# Patient Record
Sex: Male | Born: 2000
Health system: Southern US, Community
[De-identification: ages and names within clinical notes are randomized; demographics above are authoritative.]

## PROBLEM LIST (undated history)

## (undated) HISTORY — PX: NO PAST SURGERIES: SHX2092

---
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
## 2020-04-12 IMAGING — DX DG CHEST 2V
2 series · 2 of 2 positions shown · non-contrast
Comparison: None.

CLINICAL DATA: Atypical chest pain

EXAM:
CHEST - 2 VIEW

[chest pa]
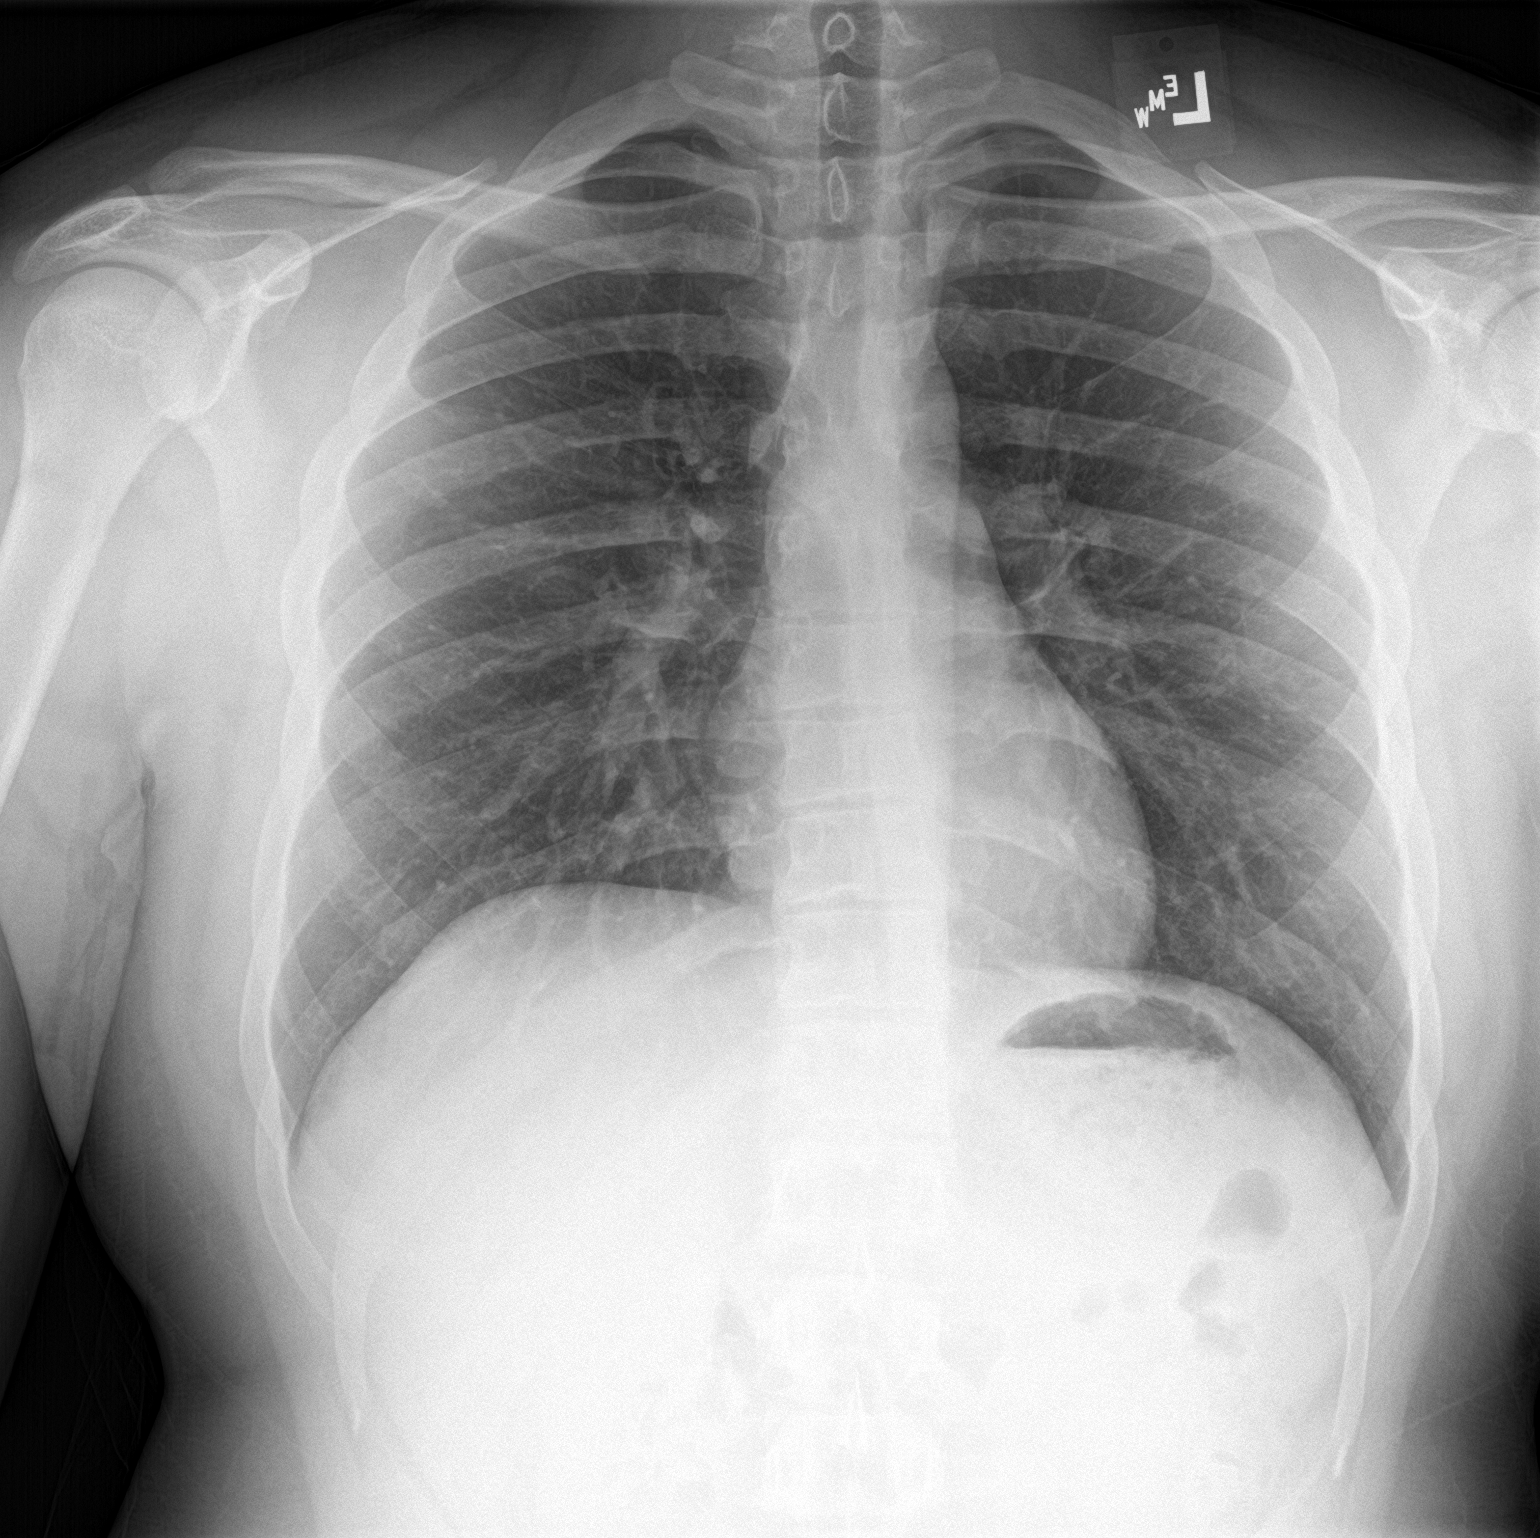

[chest lat]
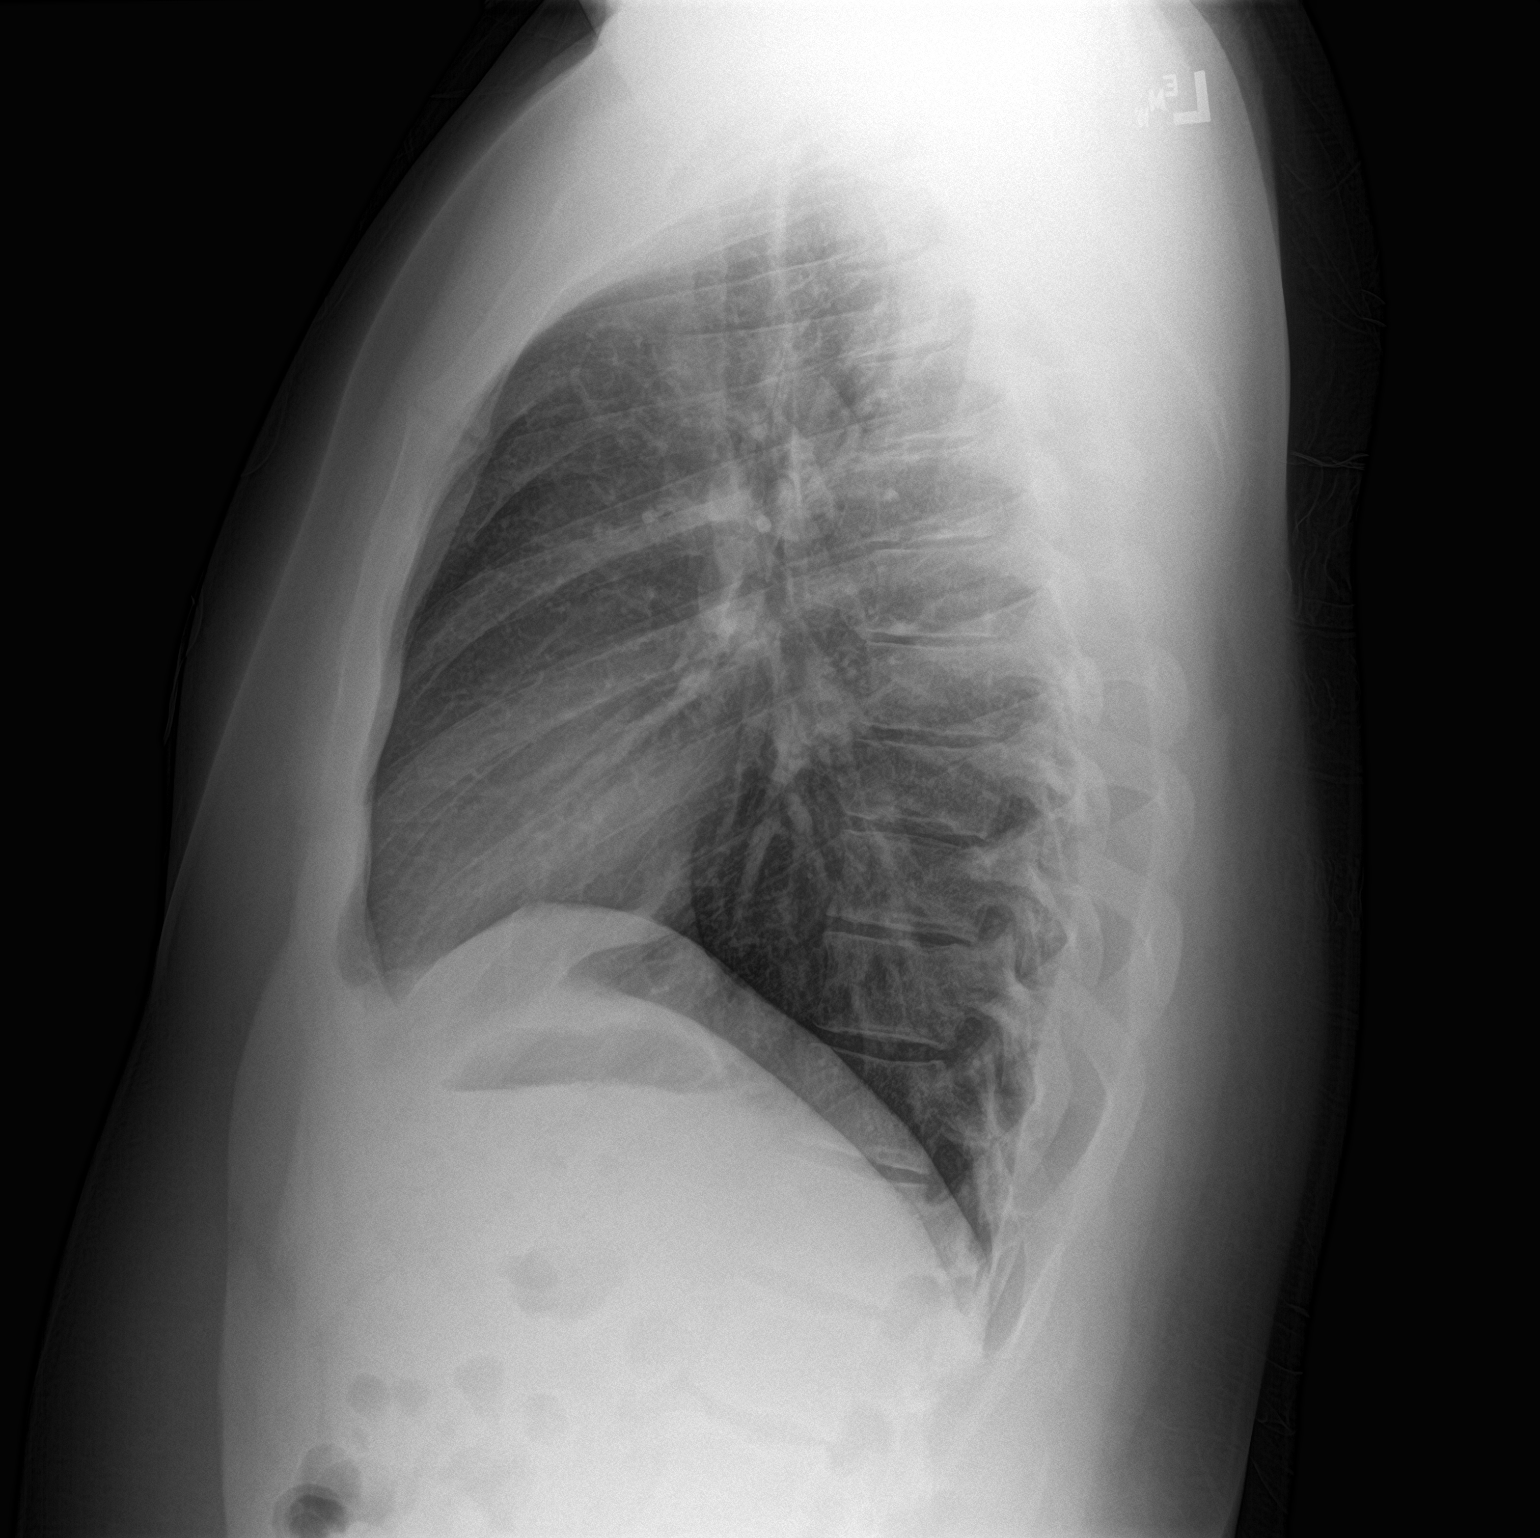

[2 of 2 positions shown; findings below may reference images not displayed]

FINDINGS: The heart size and mediastinal contours are within normal limits.
Both lungs are clear. The visualized skeletal structures are
unremarkable.
IMPRESSION: No active cardiopulmonary disease.

## 2020-11-21 ENCOUNTER — Encounter: Payer: 59 | Admitting: Family Medicine

## 2020-11-27 ENCOUNTER — Other Ambulatory Visit: Payer: Self-pay

## 2020-11-27 ENCOUNTER — Ambulatory Visit (INDEPENDENT_AMBULATORY_CARE_PROVIDER_SITE_OTHER): Payer: 59 | Admitting: Family Medicine

## 2020-11-27 ENCOUNTER — Encounter: Payer: Self-pay | Admitting: Family Medicine

## 2020-11-27 VITALS — BP 126/74 | HR 72 | Ht 72.0 in | Wt 246.0 lb

## 2020-11-27 DIAGNOSIS — Z23 Encounter for immunization: Secondary | ICD-10-CM

## 2020-11-27 DIAGNOSIS — Z0000 Encounter for general adult medical examination without abnormal findings: Secondary | ICD-10-CM

## 2020-11-27 LAB — LIPID PANEL
Cholesterol: 147 mg/dL (ref <170)
HDL: 51 mg/dL (ref >45)
LDL Cholesterol (Calc): 77 mg/dL (calc) (ref <110)
Non-HDL Cholesterol (Calc): 96 mg/dL (calc) (ref <120)
Total CHOL/HDL Ratio: 2.9 (calc) (ref <5.0)
Triglycerides: 107 mg/dL — ABNORMAL HIGH (ref <90)

## 2020-11-27 LAB — COMPLETE METABOLIC PANEL WITH GFR
AG Ratio: 1.8 (calc) (ref 1.0–2.5)
ALT: 20 U/L (ref 8–46)
AST: 17 U/L (ref 12–32)
Albumin: 4.6 g/dL (ref 3.6–5.1)
Alkaline phosphatase (APISO): 68 U/L (ref 46–169)
BUN: 16 mg/dL (ref 7–20)
CO2: 26 mmol/L (ref 20–32)
Calcium: 9.8 mg/dL (ref 8.9–10.4)
Chloride: 106 mmol/L (ref 98–110)
Creat: 0.8 mg/dL (ref 0.60–1.26)
GFR, Est African American: 150 mL/min/{1.73_m2} (ref >=60)
GFR, Est Non African American: 130 mL/min/{1.73_m2} (ref >=60)
Globulin: 2.5 g/dL (calc) (ref 2.1–3.5)
Glucose, Bld: 91 mg/dL (ref 65–99)
Potassium: 4.5 mmol/L (ref 3.8–5.1)
Sodium: 139 mmol/L (ref 135–146)
Total Bilirubin: 0.6 mg/dL (ref 0.2–1.1)
Total Protein: 7.1 g/dL (ref 6.3–8.2)

## 2020-11-27 LAB — CBC
HCT: 44.2 % (ref 38.5–50.0)
Hemoglobin: 14.8 g/dL (ref 13.2–17.1)
MCH: 27.8 pg (ref 27.0–33.0)
MCHC: 33.5 g/dL (ref 32.0–36.0)
MCV: 82.9 fL (ref 80.0–100.0)
MPV: 9.2 fL (ref 7.5–12.5)
Platelets: 380 10*3/uL (ref 140–400)
RBC: 5.33 10*6/uL (ref 4.20–5.80)
RDW: 12.5 % (ref 11.0–15.0)
WBC: 4.7 10*3/uL (ref 3.8–10.8)

## 2020-11-27 NOTE — Progress Notes (Signed)
CPE  Established Patient Office Visit  Subjective:  Patient ID: Ivan Adams, male    DOB: 2001-06-06  Age: 19 y.o. MRN: [id number]  CC:  Chief Complaint  Patient presents with  . Annual Exam    HPI LAMOYNE HESSEL presents for CPE.  Had eye exam a month ago. Wears glasses.  Exercises some but not consistently.  He is in school at [employer] in the [employer] he also works at [employer].  Not currently sexually active.  Denies drinking alcohol.  No past medical history on file.  Past Surgical History:  Procedure Laterality Date  . NO PAST SURGERIES      Family History  Problem Relation Age of Onset  . Diabetes Father   . Heart failure Other        GM  . Hypertension Mother   . Diabetes Mother   . Hypertension Sister     Social History   Socioeconomic History  . Marital status: Single    Spouse name: Not on file  . Number of children: Not on file  . Years of education: Not on file  . Highest education level: Not on file  Occupational History  . 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 Smoking status: Never Smoker  . Smokeless tobacco: Never Used  Substance and Sexual Activity  . Alcohol use: No  . Drug use: No  . Sexual activity: Not Currently  Other Topics Concern  . Not on file  Social History Narrative   Used to play soccer.  Wants to play baseketball for the summer.     Social Determinants of Health   Financial Resource Strain: Not on file  Food Insecurity: Not on file  Transportation Needs: Not on file  Physical Activity: Not on file  Stress: Not on file  Social Connections: Not on file  Intimate Partner Violence: Not on file    No outpatient medications prior to visit.   No facility-administered medications prior to visit.    No Known Allergies  ROS Review of Systems    Objective:    Physical Exam Constitutional:      Appearance: He is well-developed and well-nourished.  HENT:      Head: Normocephalic and atraumatic.     Right Ear: External ear normal.     Left Ear: External ear normal.     Nose: Nose normal.     Mouth/Throat:     Mouth: Oropharynx is clear and moist.  Eyes:     Extraocular Movements: EOM normal.     Conjunctiva/sclera: Conjunctivae normal.     Pupils: Pupils are equal, round, and reactive to light.  Neck:     Thyroid: No thyromegaly.  Cardiovascular:     Rate and Rhythm: Normal rate and regular rhythm.     Pulses: Intact distal pulses.     Heart sounds: Normal heart sounds.  Pulmonary:     Effort: Pulmonary effort is normal.     Breath sounds: Normal breath sounds.  Abdominal:     General: Bowel sounds are normal. There is no distension.     Palpations: Abdomen is soft. There is no mass.     Tenderness: There is no abdominal tenderness. There is no guarding or rebound.  Musculoskeletal:        General: Normal range of motion.     Cervical back: Normal range of motion and neck supple.  Lymphadenopathy:     Cervical:  No cervical adenopathy.  Skin:    General: Skin is warm and dry.  Neurological:     Mental Status: He is alert and oriented to person, place, and time.     Deep Tendon Reflexes: Reflexes are normal and symmetric. Reflexes normal.  Psychiatric:        Mood and Affect: Mood and affect normal.        Behavior: Behavior normal.        Thought Content: Thought content normal.        Judgment: Judgment normal.     BP 126/74   Pulse 72   Ht 6' (1.829 m)   Wt 246 lb (111.6 kg)   SpO2 100%   BMI 33.36 kg/m  Wt Readings from Last 3 Encounters:  11/27/20 246 lb (111.6 kg) (>99 %, Z= 2.33)*  11/07/19 231 lb (104.8 kg) (98 %, Z= 2.13)*  08/04/19 215 lb (97.5 kg) (97 %, Z= 1.85)*   * Growth percentiles are based on CDC (Boys, 2-20 Years) data.     Health Maintenance Due  Topic Date Due  . Hepatitis C Screening  Never done  . HIV Screening  Never done    There are no preventive care reminders to display for this  patient.  Lab Results  Component Value Date   TSH 1.57 08/04/2019   Lab Results  Component Value Date   WBC 7.8 08/04/2019   HGB 14.5 08/04/2019   HCT 44.2 08/04/2019   MCV 83.6 08/04/2019   PLT 368 08/04/2019   Lab Results  Component Value Date   NA 138 08/04/2019   K 4.5 08/04/2019   CO2 26 08/04/2019   GLUCOSE 98 08/04/2019   BUN 18 08/04/2019   CREATININE 1.16 08/04/2019   BILITOT 1.5 (H) 08/04/2019   AST 16 08/04/2019   ALT 11 08/04/2019   PROT 7.6 08/04/2019   CALCIUM 10.1 08/04/2019   Lab Results  Component Value Date   CHOL 161 06/15/2019   Lab Results  Component Value Date   HDL 39 (L) 06/15/2019   Lab Results  Component Value Date   LDLCALC 107 06/15/2019   Lab Results  Component Value Date   TRIG 64 06/15/2019   Lab Results  Component Value Date   CHOLHDL 4.1 06/15/2019   No results found for: HGBA1C    Assessment & Plan:   Problem List Items Addressed This Visit   None   Visit Diagnoses    Need for immunization against influenza    -  Primary   Relevant Orders   Flu Vaccine QUAD 36+ mos IM (Completed)   Wellness examination       Relevant Orders   CBC   COMPLETE METABOLIC PANEL WITH GFR   Lipid panel    Keep up a regular exercise program and make sure you are eating a healthy diet Your vaccines are up to date.     No orders of the defined types were placed in this encounter.   Follow-up: No follow-ups on file.    Nani Gasser, MD

## 2020-11-27 NOTE — Patient Instructions (Signed)

## 2020-11-27 NOTE — Progress Notes (Signed)
Not sexually active

## 2021-07-25 NOTE — Progress Notes (Signed)
BP 115/74   Pulse 60   Temp 98.3 F (36.8 C)   Resp 16   Wt 258 lb 9.6 oz (117.3 kg)   SpO2 97%   BMI 35.07 kg/m    Subjective:    Patient ID: Ivan Adams, male    DOB: 2000-12-31, 20 y.o.   MRN: [id number]  HPI: Ivan Adams is a 20 y.o. male presenting on 07/26/2021 for comprehensive medical examination. 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 [employer] year  He reports regular vision exams q1-5y: yes He reports regular dental exams q 79m: yes His diet consists of: well balanced He endorses exercise and/or activity of: gym 4-5 days per week, mostly weights He works at: [employer]  He denies ETOH use  He denies nictoine use He denies illegal substance use   He is never sexually active  He denies concerns today about STI  He denies concerns about skin changes today:  He denies concerns about bowel changes today:  He denies concerns about bladder changes today:   Depression Screen done today and results listed below:  Depression screen [facility] 2/9 07/26/2021 11/27/2020 06/15/2019 07/27/2017 05/22/2016  Decreased Interest 0 0 0 0 0  Down, Depressed, Hopeless 0 0 0 0 0  PHQ - 2 Score 0 0 0 0 0  Altered sleeping - - - 0 0  Tired, decreased energy - - - 1 0  Change in appetite - - - 0 1  Feeling bad or failure about yourself  - - - 0 0  Trouble concentrating - - - 0 0  Moving slowly or fidgety/restless - - - 0 0  Suicidal thoughts - - - 0 0  PHQ-9 Score - - - 1 1    The patient does not have a history of falls.      Past Medical History:  No past medical history on file.  Surgical History:  Past Surgical History:  Procedure Laterality Date   NO PAST SURGERIES      Medications:  No current outpatient medications on file prior to visit.   No current facility-administered  medications on file prior to visit.    Allergies:  No Known Allergies  Social History:  Social History   Socioeconomic History   Marital status: Single    Spouse name: Not on file   Number of children: Not on file   Years of education: Not on file   Highest education level: Not on file  Occupational History   Occupation: Student    Comment: 5th grade  Tobacco Use   Smoking status: Never   Smokeless tobacco: Never  Substance and Sexual Activity   Alcohol use: No   Drug use: No   Sexual activity: Not Currently  Other Topics Concern   Not on file  Social History Narrative   Used to play soccer.  Wants to play baseketball for the summer.     Social Determinants of Health   Financial Resource Strain: Not on file  Food Insecurity: Not on file  Transportation Needs: Not on file  Physical Activity: Not on file  Stress: Not on file  Social Connections: Not on file  Intimate Partner Violence: Not on file   Social History   Tobacco Use  Smoking Status Never  Smokeless Tobacco Never   Social History   Substance  and Sexual Activity  Alcohol Use No    Family History:  Family History  Problem Relation Age of Onset   Diabetes Father    Heart failure Other        GM   Hypertension Mother    Diabetes Mother    Hypertension Sister     Past medical history, surgical history, medications, allergies, family history and social history reviewed with patient today and changes made to appropriate areas of the chart.   All ROS negative except what is listed above and in the HPI.      Objective:    BP 115/74   Pulse 60   Temp 98.3 F (36.8 C)   Resp 16   Wt 258 lb 9.6 oz (117.3 kg)   SpO2 97%   BMI 35.07 kg/m   Wt Readings from Last 3 Encounters:  07/26/21 258 lb 9.6 oz (117.3 kg)  11/27/20 246 lb (111.6 kg) (>99 %, Z= 2.33)*  11/07/19 231 lb (104.8 kg) (98 %, Z= 2.13)*   * Growth percentiles are based on CDC (Boys, 2-20 Years) data.    Physical Exam Vitals  reviewed.  Constitutional:      Appearance: Normal appearance.  HENT:     Right Ear: Tympanic membrane normal.     Left Ear: Tympanic membrane normal.     Nose: Nose normal.  Eyes:     Extraocular Movements: Extraocular movements intact.     Pupils: Pupils are equal, round, and reactive to light.  Cardiovascular:     Rate and Rhythm: Normal rate and regular rhythm.     Pulses: Normal pulses.     Heart sounds: Normal heart sounds.  Pulmonary:     Effort: Pulmonary effort is normal.     Breath sounds: Normal breath sounds.  Abdominal:     General: Bowel sounds are normal. There is no distension.     Palpations: Abdomen is soft.     Tenderness: There is no abdominal tenderness. There is no guarding.  Musculoskeletal:        General: Normal range of motion.     Cervical back: Normal range of motion and neck supple.  Lymphadenopathy:     Cervical: No cervical adenopathy.  Skin:    General: Skin is warm and dry.  Neurological:     Mental Status: He is alert and oriented to person, place, and time.  Psychiatric:        Mood and Affect: Mood normal.        Behavior: Behavior normal.        Thought Content: Thought content normal.        Judgment: Judgment normal.    Results for orders placed or performed in visit on 11/27/20  CBC  Result Value Ref Range   WBC 4.7 3.8 - 10.8 Thousand/uL   RBC 5.33 4.20 - 5.80 Million/uL   Hemoglobin 14.8 13.2 - 17.1 g/dL   HCT 76.7 20.9 - 47.0 %   MCV 82.9 80.0 - 100.0 fL   MCH 27.8 27.0 - 33.0 pg   MCHC 33.5 32.0 - 36.0 g/dL   RDW 96.2 83.6 - 62.9 %   Platelets 380 140 - 400 Thousand/uL   MPV 9.2 7.5 - 12.5 fL  COMPLETE METABOLIC PANEL WITH GFR  Result Value Ref Range   Glucose, Bld 91 65 - 99 mg/dL   BUN 16 7 - 20 mg/dL   Creat 4.76 5.46 - 5.03 mg/dL   GFR, Est Non African American  130 > OR = 60 mL/min/1.53m2   GFR, Est African American 150 > OR = 60 mL/min/1.48m2   BUN/Creatinine Ratio NOT APPLICABLE 6 - 22 (calc)   Sodium 139 135  - 146 mmol/L   Potassium 4.5 3.8 - 5.1 mmol/L   Chloride 106 98 - 110 mmol/L   CO2 26 20 - 32 mmol/L   Calcium 9.8 8.9 - 10.4 mg/dL   Total Protein 7.1 6.3 - 8.2 g/dL   Albumin 4.6 3.6 - 5.1 g/dL   Globulin 2.5 2.1 - 3.5 g/dL (calc)   AG Ratio 1.8 1.0 - 2.5 (calc)   Total Bilirubin 0.6 0.2 - 1.1 mg/dL   Alkaline phosphatase (APISO) 68 46 - 169 U/L   AST 17 12 - 32 U/L   ALT 20 8 - 46 U/L  Lipid panel  Result Value Ref Range   Cholesterol 147 <170 mg/dL   HDL 51 >93 mg/dL   Triglycerides 818 (H) <90 mg/dL   LDL Cholesterol (Calc) 77 <299 mg/dL (calc)   Total CHOL/HDL Ratio 2.9 <5.0 (calc)   Non-HDL Cholesterol (Calc) 96 <371 mg/dL (calc)      Assessment & Plan:   Problem List Items Addressed This Visit   None Visit Diagnoses     Physically well but worried    -  Primary        LABORATORY TESTING:  Health maintenance labs ordered today as discussed above.  - STI testing: deferred    IMMUNIZATIONS:   Up to date   SCREENING: Up to date   PATIENT COUNSELING:    Sexuality: Discussed sexually transmitted diseases, partner selection, use of condoms, avoidance of unintended pregnancy, and contraceptive alternatives.    I discussed with the patient that most people either abstain from alcohol or drink within safe limits (<=14/week and <=4 drinks/occasion for males, <=7/weeks and <= 3 drinks/occasion for females) and that the risk for alcohol disorders and other health effects rises proportionally with the number of drinks per week and how often a drinker exceeds daily limits.  Discussed cessation/primary prevention of drug use and availability of treatment for abuse.   Diet: Encouraged to adjust caloric intake to maintain or achieve ideal body weight, to reduce intake of dietary saturated fat and total fat, to limit sodium intake by avoiding high sodium foods and not adding table salt, and to maintain adequate dietary potassium and calcium preferably from fresh fruits,  vegetables, and low-fat dairy products.    Emphasized the importance of regular exercise  Injury prevention: Discussed safety belts, safety helmets, smoke detector, smoking near bedding or upholstery.   Dental health: Discussed importance of regular tooth brushing, flossing, and dental visits.   Follow up plan: Patient wanted to come for a check-up before returning to college next week. His last physical was in December 2021 and he is not sure how his insurance works in regards to calendar year vs 365 days between physicals. No acute concerns today. Focused exam and general safety and health promotion discussed. Reassured patient that everything looks good today. Blood work was good in December so no need to repeat today. Patient to schedule physical per insurance guidelines, likely 365 from last physical. Patient agreeable to plan.    Return in about 6 months (around 01/26/2022) for physical. Follow-up sooner if needed.    Lollie Marrow Reola Calkins, DNP, FNP-C

## 2021-07-26 ENCOUNTER — Encounter: Payer: Self-pay | Admitting: Family Medicine

## 2021-07-26 ENCOUNTER — Ambulatory Visit (INDEPENDENT_AMBULATORY_CARE_PROVIDER_SITE_OTHER): Payer: 59 | Admitting: Family Medicine

## 2021-07-26 ENCOUNTER — Other Ambulatory Visit: Payer: Self-pay

## 2021-07-26 VITALS — BP 115/74 | HR 60 | Temp 98.3°F | Resp 16 | Wt 258.6 lb

## 2021-07-26 DIAGNOSIS — Z711 Person with feared health complaint in whom no diagnosis is made: Secondary | ICD-10-CM | POA: Diagnosis not present

## 2021-12-11 ENCOUNTER — Ambulatory Visit (INDEPENDENT_AMBULATORY_CARE_PROVIDER_SITE_OTHER): Payer: BC Managed Care – PPO | Admitting: Physician Assistant

## 2021-12-11 ENCOUNTER — Other Ambulatory Visit: Payer: Self-pay

## 2021-12-11 ENCOUNTER — Encounter: Payer: Self-pay | Admitting: Physician Assistant

## 2021-12-11 VITALS — BP 133/74 | HR 70 | Temp 97.0°F | Ht 72.0 in | Wt 259.0 lb

## 2021-12-11 DIAGNOSIS — R051 Acute cough: Secondary | ICD-10-CM

## 2021-12-11 DIAGNOSIS — J0100 Acute maxillary sinusitis, unspecified: Secondary | ICD-10-CM | POA: Diagnosis not present

## 2021-12-11 MED ORDER — AMOXICILLIN-POT CLAVULANATE 875-125 MG PO TABS
1.0000 | ORAL_TABLET | Freq: Two times a day (BID) | ORAL | 0 refills | Status: AC
Start: 2021-12-11 — End: 2021-12-21

## 2021-12-11 MED ORDER — BENZONATATE 200 MG PO CAPS: 200.0000 mg | ORAL_CAPSULE | Freq: Three times a day (TID) | ORAL | 0 refills | Status: DC | PRN

## 2021-12-11 NOTE — Patient Instructions (Signed)
Ivan Adams Med Sinus rinse A[place] nasal gel  Upper Respiratory Infection, Adult An upper respiratory infection (URI) affects the nose, throat, and upper airways that lead to the lungs. The most common type of URI is often called the common cold. URIs usually get better on their own, without medical treatment. What are the causes? A URI is caused by a germ (virus). You may catch these germs by: Breathing in droplets from an infected person's cough or sneeze. Touching something that has the germ on it (is contaminated) and then touching your mouth, nose, or eyes. What increases the risk? You are more likely to get a URI if: You are very young or very old. You have close contact with others, such as at work, school, or a health care facility. You smoke. You have long-term (chronic) heart or lung disease. You have a weakened disease-fighting system (immune system). You have nasal allergies or asthma. You have a lot of stress. You have poor nutrition. What are the signs or symptoms? Runny or stuffy (congested) nose. Cough. Sneezing. Sore throat. Headache. Feeling tired (fatigue). Fever. Not wanting to eat as much as usual. Pain in your forehead, behind your eyes, and over your cheekbones (sinus pain). Muscle aches. Redness or irritation of the eyes. Pressure in the ears or face. How is this treated? URIs usually get better on their own within 7-10 days. Medicines cannot cure URIs, but your doctor may recommend certain medicines to help relieve symptoms, such as: Over-the-counter cold medicines. Medicines to reduce coughing (cough suppressants). Coughing is a type of defense against infection that helps to clear the nose, throat, windpipe, and lungs (respiratory system). Take these medicines only as told by your doctor. Medicines to lower your fever. Follow these instructions at home: Activity Rest as needed. If you have a fever, stay home from work or school until your fever is gone, or  until your doctor says you may return to work or school. You should stay home until you cannot spread the infection anymore (you are not contagious). Your doctor may have you wear a face mask so you have less risk of spreading the infection. Relieving symptoms Rinse your mouth often with salt water. To make salt water, dissolve -1 tsp (3-6 g) of salt in 1 cup (237 mL) of warm water. Use a cool-mist humidifier to add moisture to the air. This can help you breathe more easily. Eating and drinking  Drink enough fluid to keep your pee (urine) pale yellow. Eat soups and other clear broths. General instructions  Take over-the-counter and prescription medicines only as told by your doctor. Do not smoke or use any products that contain nicotine or tobacco. If you need help quitting, ask your doctor. Avoid being where people are smoking (avoid secondhand smoke). Stay up to date on all your shots (immunizations), and get the flu shot every year. Keep all follow-up visits. How to prevent the spread of infection to others  Wash your hands with soap and water for at least 20 seconds. If you cannot use soap and water, use hand sanitizer. Avoid touching your mouth, face, eyes, or nose. Cough or sneeze into a tissue or your sleeve or elbow. Do not cough or sneeze into your hand or into the air. Contact a doctor if: You are getting worse, not better. You have any of these: A fever or chills. Brown or red mucus in your nose. Yellow or brown fluid (discharge)coming from your nose. Pain in your face, especially when you bend  forward. Swollen neck glands. Pain when you swallow. White areas in the back of your throat. Get help right away if: You have shortness of breath that gets worse. You have very bad or constant: Headache. Ear pain. Pain in your forehead, behind your eyes, and over your cheekbones (sinus pain). Chest pain. You have long-lasting (chronic) lung disease along with any of  these: Making high-pitched whistling sounds when you breathe, most often when you breathe out (wheezing). Long-lasting cough (more than 14 days). Coughing up blood. A change in your usual mucus. You have a stiff neck. You have changes in your: Vision. Hearing. Thinking. Mood. These symptoms may be an emergency. Get help right away. Call 911. Do not wait to see if the symptoms will go away. Do not drive yourself to the hospital. Summary An upper respiratory infection (URI) is caused by a germ (virus). The most common type of URI is often called the common cold. URIs usually get better within 7-10 days. Take over-the-counter and prescription medicines only as told by your doctor. This information is not intended to replace advice given to you by your health care provider. Make sure you discuss any questions you have with your health care provider. Document Revised: 06/26/2021 Document Reviewed: 06/26/2021 Elsevier Patient Education  2022 [employer].

## 2021-12-11 NOTE — Progress Notes (Signed)
° °  Subjective:    Patient ID: Ivan Adams, male    DOB: 12-Sep-2001, 20 y.o.   MRN: [id number]  HPI Pt is a 21 yo male who presents to the clinic with sinus pressure, drainage, congestion, dry cough since last Sunday, 12 days. Tested negative for covid. No trouble breathing, fever, body aches. Father was also sick. Taking dayquil and nyquil. Continues to blow out green phelgm and cough.   .. Active Ambulatory Problems    Diagnosis Date Noted   UNSPECIFIED VISUAL LOSS 02/27/2009   Overweight(278.02) 05/17/2012   Bilateral foot pain 05/30/2016   Resolved Ambulatory Problems    Diagnosis Date Noted   No Resolved Ambulatory Problems   No Additional Past Medical History     Review of Systems See HPI.    Objective:   Physical Exam Vitals reviewed.  Constitutional:      Appearance: Normal appearance. He is obese.  HENT:     Head: Normocephalic.     Right Ear: Tympanic membrane, ear canal and external ear normal. There is no impacted cerumen.     Left Ear: Tympanic membrane, ear canal and external ear normal. There is no impacted cerumen.     Nose: Congestion present.     Mouth/Throat:     Mouth: Mucous membranes are moist.     Pharynx: No oropharyngeal exudate or posterior oropharyngeal erythema.  Eyes:     Pupils: Pupils are equal, round, and reactive to light.  Cardiovascular:     Rate and Rhythm: Normal rate and regular rhythm.     Pulses: Normal pulses.  Pulmonary:     Effort: Pulmonary effort is normal.     Breath sounds: Normal breath sounds.  Abdominal:     General: Bowel sounds are normal.     Palpations: Abdomen is soft.  Musculoskeletal:     Cervical back: Neck supple.  Lymphadenopathy:     Cervical: No cervical adenopathy.  Neurological:     General: No focal deficit present.     Mental Status: He is alert and oriented to person, place, and time.  Psychiatric:        Mood and Affect: Mood normal.          Assessment & Plan:  Marland KitchenMarland KitchenWolfgang was seen today  for cough.  Diagnoses and all orders for this visit:  Acute non-recurrent maxillary sinusitis -     amoxicillin-clavulanate (AUGMENTIN) 875-125 MG tablet; Take 1 tablet by mouth 2 (two) times daily for 10 days.  Acute cough -     benzonatate (TESSALON) 200 MG capsule; Take 1 capsule (200 mg total) by mouth 3 (three) times daily as needed for cough.  Augmentin started Flonase OTC with neil med sinus rinse Tessalon for cough Follow up as needed

## 2022-02-03 ENCOUNTER — Encounter: Payer: 59 | Admitting: Family Medicine

## 2022-11-25 ENCOUNTER — Ambulatory Visit (INDEPENDENT_AMBULATORY_CARE_PROVIDER_SITE_OTHER): Payer: BC Managed Care – PPO | Admitting: Family Medicine

## 2022-11-25 DIAGNOSIS — Z23 Encounter for immunization: Secondary | ICD-10-CM

## 2024-07-18 ENCOUNTER — Telehealth (INDEPENDENT_AMBULATORY_CARE_PROVIDER_SITE_OTHER): Admitting: Family Medicine

## 2024-07-18 ENCOUNTER — Encounter: Payer: Self-pay | Admitting: Family Medicine

## 2024-07-18 ENCOUNTER — Ambulatory Visit: Admitting: Family Medicine

## 2024-07-18 VITALS — BP 122/90 | Ht 72.0 in | Wt 280.0 lb

## 2024-07-18 DIAGNOSIS — R030 Elevated blood-pressure reading, without diagnosis of hypertension: Secondary | ICD-10-CM | POA: Diagnosis not present

## 2024-07-18 NOTE — Progress Notes (Signed)
 Virtual Visit via Video Note  I connected with Ivan Adams on 07/18/24 at 10:10 AM EDT by a video enabled telemedicine application and verified that I am speaking with the correct person using two identifiers.   I discussed the limitations of evaluation and management by telemedicine and the availability of in person appointments. The patient expressed understanding and agreed to proceed.  Patient location: at home Provider location: in office  Subjective:    CC:   Chief Complaint  Patient presents with   Medical Management of Chronic Issues    Pt reports that after he had surgery on his ankle his BP has been going up. He had surgery on 7/24 and he stated that his diastolic numbers have mostly been elevated.      HPI: Mom, who is a Engineer, civil (consulting), has been checking his BP and diastolic has been in high 80s to low 90s. Systolic in the 120. Started  checking his BP after his surgery on on 7/17 and then again on 8/7.  He suffered ankle fracture after jumping from a balcony. Landed wrong on that leg.  He reports that his pain has actually been well-controlled so he has not had to take pain medications or any NSAIDs he has been on a low-dose baby aspirin just as a blood thinner because of the immobility he is not taking any decongestants.  He does not feel like he eats a lot of salt but does not necessarily follow a strict low-salt diet.  He gotten up to about 308 pounds back in the winter and had started really working hard on losing weight he is now down to 280 pounds but he is still about 20 pounds heavier than he was in 2023.  He says that they did do some labs at the hospital in [place]  [facility].  Where he had injured himself but no labs here.  They did not update his Tdap.   Past medical history, Surgical history, Family history not pertinant except as noted below, Social history, Allergies, and medications have been entered into the medical record, reviewed,  and corrections made.    Objective:    General: Speaking clearly in complete sentences without any shortness of breath.  Alert and oriented x3.  Normal judgment. No apparent acute distress.    Impression and Recommendations:    Problem List Items Addressed This Visit       Other   Elevated BP without diagnosis of hypertension - Primary   It is primarily the diastolic pressures that have been elevated.  We did discuss the DASH diet and encouraged him to go online to [email or web address] and start working on a low-salt diet potassium rich foods.  In addition to continuing to work on weight loss.  I do think that that would help.  Right now he is not able to exercise intensely but he could certainly do some upper body strengthening which could be helpful.  His follow-up with surgery is on Friday.  If blood pressure is not improving over the next 4 to 6 weeks with dietary changes then we will consider starting low-dose amlodipine.  He does have a family history of hypertension.  Will get some updated labs.      Relevant Orders   Lipid panel   TSH   CMP14+EGFR    Reminded him he is due for Tdap.  Orders Placed This Encounter  Procedures   Lipid panel   TSH   CMP14+EGFR  No orders of the defined types were placed in this encounter.    I discussed the assessment and treatment plan with the patient. The patient was provided an opportunity to ask questions and all were answered. The patient agreed with the plan and demonstrated an understanding of the instructions.   The patient was advised to call back or seek an in-person evaluation if the symptoms worsen or if the condition fails to improve as anticipated.  I spent 20 minutes on the day of the encounter to include pre-visit record review, face-to-face time with the patient and post visit ordering of test.   Dorothyann Byars, MD

## 2024-07-18 NOTE — Assessment & Plan Note (Addendum)
 It is primarily the diastolic pressures that have been elevated.  We did discuss the DASH diet and encouraged him to go online to [email or web address] and start working on a low-salt diet potassium rich foods.  In addition to continuing to work on weight loss.  I do think that that would help.  Right now he is not able to exercise intensely but he could certainly do some upper body strengthening which could be helpful.  His follow-up with surgery is on Friday.  If blood pressure is not improving over the next 4 to 6 weeks with dietary changes then we will consider starting low-dose amlodipine.  He does have a family history of hypertension.  Will get some updated labs.

## 2024-07-23 LAB — LIPID PANEL
Chol/HDL Ratio: 6.3 ratio — ABNORMAL HIGH (ref 0.0–5.0)
Cholesterol, Total: 219 mg/dL — ABNORMAL HIGH (ref 100–199)
HDL: 35 mg/dL — ABNORMAL LOW (ref >39)
LDL Chol Calc (NIH): 164 mg/dL — ABNORMAL HIGH (ref 0–99)
Triglycerides: 110 mg/dL (ref 0–149)
VLDL Cholesterol Cal: 20 mg/dL (ref 5–40)

## 2024-07-23 LAB — TSH: TSH: 2.1 u[IU]/mL (ref 0.450–4.500)

## 2024-07-23 LAB — CMP14+EGFR
ALT: 32 IU/L (ref 0–44)
AST: 22 IU/L (ref 0–40)
Albumin: 4.8 g/dL (ref 4.3–5.2)
Alkaline Phosphatase: 102 IU/L (ref 44–121)
BUN/Creatinine Ratio: 17 (ref 9–20)
BUN: 17 mg/dL (ref 6–20)
Bilirubin Total: 1.1 mg/dL (ref 0.0–1.2)
CO2: 21 mmol/L (ref 20–29)
Calcium: 10.1 mg/dL (ref 8.7–10.2)
Chloride: 101 mmol/L (ref 96–106)
Creatinine, Ser: 1.03 mg/dL (ref 0.76–1.27)
Globulin, Total: 2.4 g/dL (ref 1.5–4.5)
Glucose: 95 mg/dL (ref 70–99)
Potassium: 5.1 mmol/L (ref 3.5–5.2)
Sodium: 138 mmol/L (ref 134–144)
Total Protein: 7.2 g/dL (ref 6.0–8.5)
eGFR: 105 mL/min/1.73 (ref >59)

## 2024-07-25 ENCOUNTER — Ambulatory Visit: Payer: Self-pay | Admitting: Family Medicine

## 2024-07-25 NOTE — Progress Notes (Signed)
 HI Ivan Adams Your cholesterol is very high. I really want you to work on healthy diet and exercise to improved that number.  Thyroid and metabolic panel is normal.

## 2024-08-30 ENCOUNTER — Encounter: Payer: Self-pay | Admitting: Family Medicine

## 2024-08-30 ENCOUNTER — Ambulatory Visit (INDEPENDENT_AMBULATORY_CARE_PROVIDER_SITE_OTHER): Admitting: Family Medicine

## 2024-08-30 VITALS — BP 128/80 | HR 87 | Ht 72.0 in | Wt 273.1 lb

## 2024-08-30 DIAGNOSIS — I10 Essential (primary) hypertension: Secondary | ICD-10-CM | POA: Insufficient documentation

## 2024-08-30 DIAGNOSIS — Z23 Encounter for immunization: Secondary | ICD-10-CM

## 2024-08-30 DIAGNOSIS — Z6837 Body mass index (BMI) 37.0-37.9, adult: Secondary | ICD-10-CM

## 2024-08-30 MED ORDER — AMLODIPINE BESYLATE 2.5 MG PO TABS: 2.5000 mg | ORAL_TABLET | Freq: Every day | ORAL | 1 refills | Status: DC

## 2024-08-30 NOTE — Progress Notes (Signed)
 Established Patient Office Visit  Subjective  Patient ID: Ivan Adams, male    DOB: [birth year]  Age: 23 y.o. MRN: [id number]  Chief Complaint  Patient presents with   Hypertension    HPI Discussed the use of AI scribe software for clinical note transcription with the patient, who gave verbal consent to proceed.  History of Present Illness Ivan Adams is a 23 year old male with hypertension who presents for blood pressure management. He is accompanied by his mother.  Hypertension management - Diastolic blood pressure ranges between 85 and 90 mmHg, with occasional readings over 90 mmHg. Mom is nurse and has been checking his pressures.  - Systolic blood pressure in the 120s - Following the DASH diet for blood pressure control for > 1 months.  - Adhering to a low salt diet and cautious about salt intake, especially with an upcoming cruise where dietary control may be challenging  Mobility limitation and gait disturbance - Physical activity limited due to recent injury and surgery on left leg requiring crutches - Using a shoe lift on right side to to aid in walking - Plans to discontinue crutches in two weeks - Using a temporary handicap placard, which is expiring soon and requires renewal  Elevated heart rate - Heart rate elevated, ranging between 88 and 96 bpm - Attributes elevated heart rate to inactivity and pain from injury     ROS    Objective:     BP 128/80   Pulse 87   Ht 6' (1.829 m)   Wt 273 lb 1.9 oz (123.9 kg)   SpO2 99%   BMI 37.04 kg/m    Physical Exam Vitals and nursing note reviewed.  Constitutional:      Appearance: Normal appearance.  HENT:     Head: Normocephalic and atraumatic.  Eyes:     Conjunctiva/sclera: Conjunctivae normal.  Cardiovascular:     Rate and Rhythm: Normal rate and regular rhythm.  Pulmonary:     Effort: Pulmonary effort is normal.     Breath sounds: Normal breath sounds.  Skin:    General: Skin is warm and dry.   Neurological:     Mental Status: He is alert.  Psychiatric:        Mood and Affect: Mood normal.      No results found for any visits on 08/30/24.    The ASCVD Risk score (Arnett DK, et al., 2019) failed to calculate for the following reasons:   The 2019 ASCVD risk score is only valid for ages 69 to 104    Assessment & Plan:   Problem List Items Addressed This Visit       Cardiovascular and Mediastinum   Primary hypertension - Primary   Relevant Medications   aspirin EC 81 MG tablet   amLODipine  (NORVASC ) 2.5 MG tablet   Other Visit Diagnoses       BMI 37.0-37.9, adult         Encounter for immunization       Relevant Orders   Flu vaccine trivalent PF, 6mos and older(Flulaval,Afluria,Fluarix,Fluzone) (Completed)   Tdap vaccine greater than or equal to 7yo IM (Completed)      Assessment and Plan Assessment & Plan Hypertension Diastolic pressure elevated, systolic improved. Focus on diastolic management. - Prescribe amlodipine  2.5 mg daily. - Discuss potential side effects of amlodipine . - Continue DASH diet with low salt intake. - Monitor blood pressure regularly. - Reassess blood pressure in 2-3 weeks, adjust amlodipine  if necessary. -  Encourage weight loss and BMI reduction when mobility improves.  Obesity/BMI 37 Obesity exacerbating hypertension. Weight loss needed. - Encourage weight loss and BMI reduction when mobility improves.  Limited mobility after lower extremity injury with possible hardware Limited mobility due to injury, using crutches. Discussed potential hardware issues. - Provide temporary handicap placard.    Return in about 3 months (around 11/29/2024) for Hypertension.    Dorothyann Byars, MD

## 2024-11-29 ENCOUNTER — Ambulatory Visit: Admitting: Family Medicine

## 2024-11-29 ENCOUNTER — Encounter: Payer: Self-pay | Admitting: Family Medicine

## 2024-11-29 VITALS — BP 127/83 | HR 66 | Ht 72.0 in | Wt 274.1 lb

## 2024-11-29 DIAGNOSIS — I10 Essential (primary) hypertension: Secondary | ICD-10-CM

## 2024-11-29 MED ORDER — AMLODIPINE BESYLATE 5 MG PO TABS: 5.0000 mg | ORAL_TABLET | Freq: Every day | ORAL | 1 refills | Status: AC

## 2024-11-29 NOTE — Progress Notes (Signed)
" ° °  Established Patient Office Visit  Patient ID: Ivan Adams, male    DOB: [birth year]  Age: 23 y.o. MRN: [id number] PCP: Alvan Dorothyann BIRCH, MD  Chief Complaint  Patient presents with   Hypertension    Pt is doing well. He states that he has not been able to get into the Gym at this time and is hoping to start at the beginning of the year. He has 1 PT appt left which is today     Subjective:     HPI  Discussed the use of AI scribe software for clinical note transcription with the patient, who gave verbal consent to proceed.  History of Present Illness Ivan Adams is a 23 year old male with hypertension who presents for blood pressure management.  Hypertension - Blood pressure has improved with home monitoring, with systolic readings mostly in the 120s and diastolic readings often closer to 90. - Recent diastolic readings have decreased to between 85 and 88. - Currently taking amlodipine  without experiencing any side effects.  Physical activity - Making efforts to stay active and continuing with physical therapy. - Goes to the gym occasionally but not consistently.     ROS    Objective:     BP 127/83   Pulse 66   Ht 6' (1.829 m)   Wt 274 lb 1.9 oz (124.3 kg)   SpO2 100%   BMI 37.18 kg/m    Physical Exam Vitals and nursing note reviewed.  Constitutional:      Appearance: Normal appearance.  HENT:     Head: Normocephalic and atraumatic.  Eyes:     Conjunctiva/sclera: Conjunctivae normal.  Cardiovascular:     Rate and Rhythm: Normal rate and regular rhythm.  Pulmonary:     Effort: Pulmonary effort is normal.     Breath sounds: Normal breath sounds.  Skin:    General: Skin is warm and dry.  Neurological:     Mental Status: He is alert.  Psychiatric:        Mood and Affect: Mood normal.      No results found for any visits on 11/29/24.    The ASCVD Risk score (Arnett DK, et al., 2019) failed to calculate for the following reasons:   The  2019 ASCVD risk score is only valid for ages 2 to 80   * - Cholesterol units were assumed    Assessment & Plan:   Problem List Items Addressed This Visit       Cardiovascular and Mediastinum   Primary hypertension - Primary   Relevant Medications   amLODipine  (NORVASC ) 5 MG tablet    Assessment and Plan Assessment & Plan Primary hypertension Blood pressure improved with systolic 120s, diastolic 85-90. Goal: diastolic <85. No side effects from amlodipine  2.5 mg. - Increased amlodipine  to 5 mg daily. - Instructed to monitor for ankle swelling. - Continue home blood pressure monitoring. - Encouraged Mediterranean diet and regular exercise. - Scheduled follow-up in six months, option to adjust medication via MyChart if needed.    Return in about 6 months (around 05/30/2025) for Hypertension.    Dorothyann Alvan, MD [facility] at [facility]   "

## 2024-11-29 NOTE — Addendum Note (Signed)
 Addended by: Giordan Fordham D on: 11/29/2024 04:39 PM   Modules accepted: Orders

## 2025-05-29 ENCOUNTER — Ambulatory Visit: Admitting: Family Medicine
